# Patient Record
Sex: Male | Born: 1990 | Race: White | Hispanic: No | Marital: Single | Smoking: Current every day smoker
Health system: Southern US, Community
[De-identification: ages and names within clinical notes are randomized; demographics above are authoritative.]

## PROBLEM LIST (undated history)

## (undated) DIAGNOSIS — F329 Major depressive disorder, single episode, unspecified: Secondary | ICD-10-CM

## (undated) DIAGNOSIS — F419 Anxiety disorder, unspecified: Secondary | ICD-10-CM

## (undated) DIAGNOSIS — F319 Bipolar disorder, unspecified: Secondary | ICD-10-CM

## (undated) DIAGNOSIS — F32A Depression, unspecified: Secondary | ICD-10-CM

## (undated) DIAGNOSIS — F909 Attention-deficit hyperactivity disorder, unspecified type: Secondary | ICD-10-CM

---
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
## 2014-10-01 ENCOUNTER — Encounter (HOSPITAL_COMMUNITY): Payer: Self-pay | Admitting: Emergency Medicine

## 2014-10-01 ENCOUNTER — Emergency Department (HOSPITAL_COMMUNITY)
Admission: EM | Admit: 2014-10-01 | Discharge: 2014-10-02 | Disposition: A | Discharge status: Home / Self Care | Payer: Medicaid Other | Attending: Emergency Medicine | Admitting: Emergency Medicine

## 2014-10-01 DIAGNOSIS — Y929 Unspecified place or not applicable: Secondary | ICD-10-CM | POA: Insufficient documentation

## 2014-10-01 DIAGNOSIS — S01112A Laceration without foreign body of left eyelid and periocular area, initial encounter: Secondary | ICD-10-CM | POA: Insufficient documentation

## 2014-10-01 DIAGNOSIS — W540XXA Bitten by dog, initial encounter: Secondary | ICD-10-CM | POA: Insufficient documentation

## 2014-10-01 DIAGNOSIS — S0185XA Open bite of other part of head, initial encounter: Secondary | ICD-10-CM | POA: Insufficient documentation

## 2014-10-01 DIAGNOSIS — Z23 Encounter for immunization: Secondary | ICD-10-CM | POA: Diagnosis not present

## 2014-10-01 DIAGNOSIS — S01552A Open bite of oral cavity, initial encounter: Secondary | ICD-10-CM | POA: Insufficient documentation

## 2014-10-01 DIAGNOSIS — Y939 Activity, unspecified: Secondary | ICD-10-CM | POA: Diagnosis not present

## 2014-10-01 DIAGNOSIS — Z720 Tobacco use: Secondary | ICD-10-CM | POA: Insufficient documentation

## 2014-10-01 DIAGNOSIS — Y999 Unspecified external cause status: Secondary | ICD-10-CM | POA: Insufficient documentation

## 2014-10-01 MED ORDER — TETANUS-DIPHTH-ACELL PERTUSSIS 5-2.5-18.5 LF-MCG/0.5 IM SUSP
0.5000 mL | Freq: Once | INTRAMUSCULAR | Status: AC
  Administered 2014-10-02: 0.5 mL via INTRAMUSCULAR
  Filled 2014-10-01: qty 0.5

## 2014-10-01 MED ORDER — OXYCODONE-ACETAMINOPHEN 5-325 MG PO TABS
1.0000 | ORAL_TABLET | Freq: Once | ORAL | Status: AC
  Administered 2014-10-02: 1 via ORAL
  Filled 2014-10-01: qty 1

## 2014-10-01 MED ORDER — LIDOCAINE-EPINEPHRINE-TETRACAINE (LET) SOLUTION
3.0000 mL | Freq: Once | NASAL | Status: AC
  Administered 2014-10-02: 3 mL via TOPICAL
  Filled 2014-10-01: qty 3

## 2014-10-01 MED ORDER — RABIES IMMUNE GLOBULIN 150 UNIT/ML IM INJ
1500.0000 [IU] | INJECTION | Freq: Once | INTRAMUSCULAR | Status: AC
  Administered 2014-10-02: 1500 [IU] via INTRAMUSCULAR
  Filled 2014-10-01: qty 10

## 2014-10-01 MED ORDER — RABIES VACCINE, PCEC IM SUSR
1.0000 mL | Freq: Once | INTRAMUSCULAR | Status: AC
  Administered 2014-10-02: 1 mL via INTRAMUSCULAR
  Filled 2014-10-01: qty 1
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
## 2014-10-02 MED ORDER — HYDROCODONE-ACETAMINOPHEN 5-325 MG PO TABS: 1.0000 | ORAL_TABLET | Freq: Four times a day (QID) | ORAL | Status: DC | PRN

## 2014-10-02 MED ORDER — AMOXICILLIN-POT CLAVULANATE 875-125 MG PO TABS: 1.0000 | ORAL_TABLET | Freq: Two times a day (BID) | ORAL | Status: DC

## 2014-10-02 NOTE — Discharge Instructions (Signed)
You have been bitten by a dog.  There's a chance of rabies exposure.  Please follow instruction below and follow up with Urgent Care for rabies series.  Take pain medication and antibiotic as prescribed.  Follow up with plastic surgeon in 1 week for further treatment of your facial laceration.  Keep wound cleansed, return if you notice signs of infection.  Animal Bite An animal bite can result in a scratch on the skin, deep open cut, puncture of the skin, crush injury, or tearing away of the skin or a body part. Dogs are responsible for most animal bites. Children are bitten more often than adults. An animal bite can range from very mild to more serious. A small bite from your house pet is no cause for alarm. However, some animal bites can become infected or injure a bone or other tissue. You must seek medical care if:  The skin is broken and bleeding does not slow down or stop after 15 minutes.  The puncture is deep and difficult to clean (such as a cat bite).  Pain, warmth, redness, or pus develops around the wound.  The bite is from a stray animal or rodent. There may be a risk of rabies infection.  The bite is from a snake, raccoon, skunk, fox, coyote, or bat. There may be a risk of rabies infection.  The person bitten has a chronic illness such as diabetes, liver disease, or cancer, or the person takes medicine that lowers the immune system.  There is concern about the location and severity of the bite. It is important to clean and protect an animal bite wound right away to prevent infection. Follow these steps:  Clean the wound with plenty of water and soap.  Apply an antibiotic cream.  Apply gentle pressure over the wound with a clean towel or gauze to slow or stop bleeding.  Elevate the affected area above the heart to help stop any bleeding.  Seek medical care. Getting medical care within 8 hours of the animal bite leads to the best possible outcome. DIAGNOSIS  Your caregiver  will most likely:  Take a detailed history of the animal and the bite injury.  Perform a wound exam.  Take your medical history. Blood tests or X-rays may be performed. Sometimes, infected bite wounds are cultured and sent to a lab to identify the infectious bacteria.  TREATMENT  Medical treatment will depend on the location and type of animal bite as well as the patient's medical history. Treatment may include:  Wound care, such as cleaning and flushing the wound with saline solution, bandaging, and elevating the affected area.  Antibiotics.  Tetanus immunization.  Rabies immunization.  Leaving the wound open to heal. This is often done with animal bites, due to the high risk of infection. However, in certain cases, wound closure with stitches, wound adhesive, skin adhesive strips, or staples may be used. Infected bites that are left untreated may require intravenous (IV) antibiotics and surgical treatment in the hospital. HOME CARE INSTRUCTIONS  Follow your caregiver's instructions for wound care.  Take all medicines as directed.  If your caregiver prescribes antibiotics, take them as directed. Finish them even if you start to feel better.  Follow up with your caregiver for further exams or immunizations as directed. You may need a tetanus shot if:  You cannot remember when you had your last tetanus shot.  You have never had a tetanus shot.  The injury broke your skin. If you get a  tetanus shot, your arm may swell, get red, and feel warm to the touch. This is common and not a problem. If you need a tetanus shot and you choose not to have one, there is a rare chance of getting tetanus. Sickness from tetanus can be serious. SEEK MEDICAL CARE IF:  You notice warmth, redness, soreness, swelling, pus discharge, or a bad smell coming from the wound.  You have a red line on the skin coming from the wound.  You have a fever, chills, or a general ill feeling.  You have nausea  or vomiting.  You have continued or worsening pain.  You have trouble moving the injured part.  You have other questions or concerns. MAKE SURE YOU:  Understand these instructions.  Will watch your condition.  Will get help right away if you are not doing well or get worse. Document Released: 03/14/2011 Document Revised: 09/18/2011 Document Reviewed: 03/14/2011 [facility] Patient Information 2015 [place], [place]. This information is not intended to replace advice given to you by your health care provider. Make sure you discuss any questions you have with your health care provider.       [facility]     [address]                                             [place], [place] [address]              [phone]                                  INSTRUCTIONS FOR THE PATIENT  Patient's Name: Ricardo Gay                     Original Order Date:  10/02/2014  Medical Record Number: [id number]  ED Physician: . Primary Diagnosis: Rabies Exposure       PCP: No primary care provider on file. Marland Kitchen RABIES VACCINE:  Patient Phone Number: (home) [phone] (home)    (cell)  No relevant phone numbers on file.    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 Species of Animal: dogs (1) IMMUNOGLOBULIN INJECTION GIVEN IN THE ER?: yes   You have been seen in the Emergency Department for a possible rabies exposure.  You must return for the additional vaccine doses to the Urgent Care Center ([address]) next to [facility].  If needed, your immunoglobulin follow-up injections, need to be scheduled for the dates below. If your first visit should fall on a weekend day, please come anytime between the hours of 9am-7pm.  DAY 0:  Date 3/25     To: ER  DAY 3:  Date 3/28     To:  Urgent Care  DAY 7:  Date 4/1     To:  Urgent Care  DAY 14:  Date 4/8   To:  Urgent Care   The Urgent Care Center is open from 8am-8pm Monday thru Friday and 9am-7pm on Saturdays and  Sundays.  There will be a minimal fee for the injection that will be billed to your insurance company along with the charge for the vaccine.  Date: 10/02/2014  Patient Signature: _________________________________________________  [facility] Copy   Patient Copy      Pharmacy Copy

## 2015-02-06 ENCOUNTER — Emergency Department (HOSPITAL_COMMUNITY)
Admission: EM | Admit: 2015-02-06 | Discharge: 2015-02-06 | Disposition: A | Discharge status: Home / Self Care | Payer: Medicaid Other | Attending: Emergency Medicine | Admitting: Emergency Medicine

## 2015-02-06 ENCOUNTER — Emergency Department (HOSPITAL_COMMUNITY): Payer: Medicaid Other

## 2015-02-06 ENCOUNTER — Encounter (HOSPITAL_COMMUNITY): Payer: Self-pay | Admitting: Emergency Medicine

## 2015-02-06 DIAGNOSIS — S61215A Laceration without foreign body of left ring finger without damage to nail, initial encounter: Secondary | ICD-10-CM | POA: Diagnosis not present

## 2015-02-06 DIAGNOSIS — Y998 Other external cause status: Secondary | ICD-10-CM | POA: Insufficient documentation

## 2015-02-06 DIAGNOSIS — S70312A Abrasion, left thigh, initial encounter: Secondary | ICD-10-CM | POA: Diagnosis not present

## 2015-02-06 DIAGNOSIS — Z720 Tobacco use: Secondary | ICD-10-CM | POA: Diagnosis not present

## 2015-02-06 DIAGNOSIS — S70311A Abrasion, right thigh, initial encounter: Secondary | ICD-10-CM | POA: Insufficient documentation

## 2015-02-06 DIAGNOSIS — Y9389 Activity, other specified: Secondary | ICD-10-CM | POA: Diagnosis not present

## 2015-02-06 DIAGNOSIS — S40212A Abrasion of left shoulder, initial encounter: Secondary | ICD-10-CM | POA: Diagnosis not present

## 2015-02-06 DIAGNOSIS — S42032A Displaced fracture of lateral end of left clavicle, initial encounter for closed fracture: Secondary | ICD-10-CM | POA: Insufficient documentation

## 2015-02-06 DIAGNOSIS — Y92410 Unspecified street and highway as the place of occurrence of the external cause: Secondary | ICD-10-CM | POA: Diagnosis not present

## 2015-02-06 DIAGNOSIS — S4992XA Unspecified injury of left shoulder and upper arm, initial encounter: Secondary | ICD-10-CM | POA: Diagnosis present

## 2015-02-06 DIAGNOSIS — S42002A Fracture of unspecified part of left clavicle, initial encounter for closed fracture: Secondary | ICD-10-CM

## 2015-02-06 MED ORDER — FENTANYL CITRATE (PF) 100 MCG/2ML IJ SOLN: 100.0000 ug | Freq: Once | INTRAMUSCULAR | Status: DC

## 2015-02-06 MED ORDER — OXYCODONE-ACETAMINOPHEN 5-325 MG PO TABS: 1.0000 | ORAL_TABLET | ORAL | Status: DC | PRN

## 2015-02-06 MED ORDER — LIDOCAINE HCL (PF) 2 % IJ SOLN
10.0000 mL | Freq: Once | INTRAMUSCULAR | Status: AC
  Administered 2015-02-06: 10 mL
  Filled 2015-02-06: qty 10

## 2015-02-06 MED ORDER — HYDROMORPHONE HCL 1 MG/ML IJ SOLN
1.0000 mg | Freq: Once | INTRAMUSCULAR | Status: AC
  Administered 2015-02-06: 1 mg via INTRAVENOUS
  Filled 2015-02-06: qty 1

## 2015-02-06 MED ORDER — FENTANYL CITRATE (PF) 100 MCG/2ML IJ SOLN
100.0000 ug | Freq: Once | INTRAMUSCULAR | Status: AC
  Administered 2015-02-06: 100 ug via INTRAVENOUS
  Filled 2015-02-06 (×2): qty 2
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
## 2015-02-06 NOTE — ED Notes (Signed)
Pt able to ambulate independently 

## 2015-02-06 NOTE — Progress Notes (Signed)
Orthopedic Tech Progress Note Patient Details:  Ricardo Gay 1991/01/21 [id number]  Ortho Devices Type of Ortho Device: Finger splint Ortho Device/Splint Interventions: Application   Cammer, Mickie Bail 02/06/2015, 4:18 PM

## 2015-04-16 ENCOUNTER — Encounter (HOSPITAL_COMMUNITY): Payer: Self-pay | Admitting: Emergency Medicine

## 2015-04-16 ENCOUNTER — Emergency Department (HOSPITAL_COMMUNITY)
Admission: EM | Admit: 2015-04-16 | Discharge: 2015-04-17 | Disposition: A | Discharge status: Home / Self Care | Payer: Medicaid Other | Attending: Emergency Medicine | Admitting: Emergency Medicine

## 2015-04-16 DIAGNOSIS — F1210 Cannabis abuse, uncomplicated: Secondary | ICD-10-CM | POA: Insufficient documentation

## 2015-04-16 DIAGNOSIS — F4325 Adjustment disorder with mixed disturbance of emotions and conduct: Secondary | ICD-10-CM | POA: Diagnosis not present

## 2015-04-16 DIAGNOSIS — F4329 Adjustment disorder with other symptoms: Secondary | ICD-10-CM

## 2015-04-16 DIAGNOSIS — R45851 Suicidal ideations: Secondary | ICD-10-CM

## 2015-04-16 DIAGNOSIS — F911 Conduct disorder, childhood-onset type: Secondary | ICD-10-CM | POA: Diagnosis not present

## 2015-04-16 DIAGNOSIS — R4689 Other symptoms and signs involving appearance and behavior: Secondary | ICD-10-CM

## 2015-04-16 DIAGNOSIS — Z720 Tobacco use: Secondary | ICD-10-CM | POA: Diagnosis not present

## 2015-04-16 DIAGNOSIS — Z046 Encounter for general psychiatric examination, requested by authority: Secondary | ICD-10-CM | POA: Diagnosis present

## 2015-04-16 LAB — CBC
HCT: 54.3 % — ABNORMAL HIGH (ref 39.0–52.0)
Hemoglobin: 19.6 g/dL — ABNORMAL HIGH (ref 13.0–17.0)
MCH: 33.7 pg (ref 26.0–34.0)
MCHC: 36.1 g/dL — ABNORMAL HIGH (ref 30.0–36.0)
MCV: 93.3 fL (ref 78.0–100.0)
Platelets: 215 10*3/uL (ref 150–400)
RBC: 5.82 MIL/uL — ABNORMAL HIGH (ref 4.22–5.81)
RDW: 11.9 % (ref 11.5–15.5)
WBC: 8.9 10*3/uL (ref 4.0–10.5)

## 2015-04-16 LAB — COMPREHENSIVE METABOLIC PANEL
ALK PHOS: 94 U/L (ref 38–126)
ALT: 29 U/L (ref 17–63)
ANION GAP: 7 (ref 5–15)
AST: 28 U/L (ref 15–41)
Albumin: 4.5 g/dL (ref 3.5–5.0)
BUN: 12 mg/dL (ref 6–20)
CALCIUM: 9.6 mg/dL (ref 8.9–10.3)
CO2: 27 mmol/L (ref 22–32)
CREATININE: 0.72 mg/dL (ref 0.61–1.24)
Chloride: 105 mmol/L (ref 101–111)
Glucose, Bld: 113 mg/dL — ABNORMAL HIGH (ref 65–99)
Potassium: 4.5 mmol/L (ref 3.5–5.1)
SODIUM: 139 mmol/L (ref 135–145)
TOTAL PROTEIN: 7.9 g/dL (ref 6.5–8.1)
Total Bilirubin: 0.8 mg/dL (ref 0.3–1.2)

## 2015-04-16 LAB — ACETAMINOPHEN LEVEL: Acetaminophen (Tylenol), Serum: <10 ug/mL — ABNORMAL LOW (ref 10–30)

## 2015-04-16 LAB — ETHANOL

## 2015-04-16 LAB — SALICYLATE LEVEL: Salicylate Lvl: <4 mg/dL (ref 2.8–30.0)

## 2015-04-16 MED ORDER — ALUM & MAG HYDROXIDE-SIMETH 200-200-20 MG/5ML PO SUSP: 30.0000 mL | ORAL | Status: DC | PRN

## 2015-04-16 MED ORDER — NICOTINE 21 MG/24HR TD PT24
21.0000 mg | MEDICATED_PATCH | Freq: Every day | TRANSDERMAL | Status: DC
  Administered 2015-04-17: 21 mg via TRANSDERMAL
  Filled 2015-04-16: qty 1

## 2015-04-16 MED ORDER — ZOLPIDEM TARTRATE 5 MG PO TABS: 5.0000 mg | ORAL_TABLET | Freq: Every evening | ORAL | Status: DC | PRN

## 2015-04-16 MED ORDER — ACETAMINOPHEN 325 MG PO TABS: 650.0000 mg | ORAL_TABLET | ORAL | Status: DC | PRN

## 2015-04-16 MED ORDER — IBUPROFEN 200 MG PO TABS: 600.0000 mg | ORAL_TABLET | Freq: Three times a day (TID) | ORAL | Status: DC | PRN

## 2015-04-16 MED ORDER — LORAZEPAM 1 MG PO TABS: 1.0000 mg | ORAL_TABLET | Freq: Three times a day (TID) | ORAL | Status: DC | PRN

## 2015-04-16 MED ORDER — ONDANSETRON HCL 4 MG PO TABS: 4.0000 mg | ORAL_TABLET | Freq: Three times a day (TID) | ORAL | Status: DC | PRN

## 2015-04-16 NOTE — ED Notes (Signed)
Pt. Noted sleeping in room. No complaints or concerns voiced. No distress or abnormal behavior noted. Will continue to monitor with security cameras. Q 15 minute rounds continue. 

## 2015-04-16 NOTE — ED Notes (Signed)
Pt. To [facility] from ED ambulatory without difficulty, to room 36 . Report from [facility]. Pt. Is alert and oriented, warm and dry in no distress. Pt. Denies SI, HI, and AVH. Pt. Calm and cooperative. Pt. Made aware of security cameras and Q15 minute rounds. Pt. Encouraged to let Nursing staff know of any concerns or needs.
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
## 2015-04-16 NOTE — [facility] Counselor (Signed)
Disposition: Per Hulan Fess, NP, Pt to be re-evaluated by psychiatry in the morning in order to uphold or rescind IVC.
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
## 2015-04-16 NOTE — ED Notes (Signed)
Bed: [facility]4 Expected date:  Expected time:  Means of arrival:  Comments: IVC 
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
## 2015-04-16 NOTE — ED Notes (Signed)
Pt. Noted in room. No complaints or concerns voiced. No distress or abnormal behavior noted. Will continue to monitor with security cameras. Q 15 minute rounds continue. 

## 2015-04-16 NOTE — ED Notes (Signed)
Pt denies SI/HI. Pt calm and cooperative in triage.

## 2015-04-17 DIAGNOSIS — F4329 Adjustment disorder with other symptoms: Secondary | ICD-10-CM | POA: Diagnosis present

## 2015-04-17 LAB — RAPID URINE DRUG SCREEN, HOSP PERFORMED
AMPHETAMINES: NOT DETECTED
BARBITURATES: NOT DETECTED
Benzodiazepines: NOT DETECTED
Cocaine: NOT DETECTED
OPIATES: NOT DETECTED
TETRAHYDROCANNABINOL: POSITIVE — AB

## 2015-04-17 NOTE — ED Notes (Signed)
Pt. Noted sleeping in room. No complaints or concerns voiced. No distress or abnormal behavior noted. Will continue to monitor with security cameras. Q 15 minute rounds continue. 
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
## 2015-04-17 NOTE — [facility] Suicide Risk Assessment (Signed)
Suicide Risk Assessment  Discharge Assessment   [facility] Discharge Suicide Risk Assessment   Demographic Factors:  Male, Adolescent or young adult and Caucasian  Total Time spent with patient: 45 minutes  Musculoskeletal: Strength & Muscle Tone: within normal limits Gait & Station: normal Patient leans: N/A  Psychiatric Specialty Exam: Review of Systems  Constitutional: Negative.   HENT: Negative.   Eyes: Negative.   Respiratory: Negative.   Cardiovascular: Negative.   Gastrointestinal: Negative.   Genitourinary: Negative.   Musculoskeletal: Negative.   Skin: Negative.   Neurological: Negative.   Endo/Heme/Allergies: Negative.   Psychiatric/Behavioral: Positive for substance abuse.    Blood pressure 113/66, pulse 59, temperature 98.4 F (36.9 C), temperature source Oral, resp. rate 16, SpO2 98 %.There is no weight on file to calculate BMI.  General Appearance: Casual  Eye Contact::  Good  Speech:  Normal Rate  Volume:  Normal  Mood:  Euthymic  Affect:  Congruent  Thought Process:  Coherent  Orientation:  Full (Time, Place, and Person)  Thought Content:  WDL  Suicidal Thoughts:  No  Homicidal Thoughts:  No  Memory:  Immediate;   Good Recent;   Good Remote;   Good  Judgement:  Fair  Insight:  Good  Psychomotor Activity:  Normal  Concentration:  Good  Recall:  Good  Fund of Knowledge:Good  Language: Good  Akathisia:  No  Handed:  Right  AIMS (if indicated):     Assets:  Housing Intimacy Leisure Time Physical Health Resilience Social Support  ADL's:  Intact  Cognition: WNL  Sleep:         Has this patient used any form of tobacco in the last 30 days? (Cigarettes, Smokeless Tobacco, Cigars, and/or Pipes) No  Mental Status Per Nursing Assessment::   On Admission:   Altercation with his grandmother  Current Mental Status by Physician: NA  Loss Factors: NA  Historical Factors: NA  Risk Reduction Factors:   Sense of responsibility to family, Living  with another person, especially a relative and Positive social support  Continued Clinical Symptoms:  None  Cognitive Features That Contribute To Risk:  None    Suicide Risk:  Minimal: No identifiable suicidal ideation.  Patients presenting with no risk factors but with morbid ruminations; may be classified as minimal risk based on the severity of the depressive symptoms  Principal Problem: Adjustment disorder with disturbance of emotion Discharge Diagnoses:  Patient Active Problem List   Diagnosis Date Noted  . Adjustment disorder with disturbance of emotion [F43.29] 04/17/2015    Priority: High      Plan Of Care/Follow-up recommendations:  Activity:  as tolerated Diet:  heart healthy diet  Is patient on multiple antipsychotic therapies at discharge:  No   Has Patient had three or more failed trials of antipsychotic monotherapy by history:  No  Recommended Plan for Multiple Antipsychotic Therapies: NA    LORD, JAMISON, PMH-NP 04/17/2015, 2:10 PM

## 2015-04-17 NOTE — ED Notes (Signed)
Dr Shela Commons and Catha Nottingham np into see

## 2015-04-17 NOTE — ED Notes (Signed)
IVC has been rescinded pt to be dc'd home

## 2015-04-17 NOTE — ED Notes (Signed)
Patient resting in bed watching TV

## 2015-04-17 NOTE — ED Notes (Addendum)
Written dc instructions reviewed w/ pt.  Pt encouraged to return/seek treatment for any suicidal/homicidal thoughts or urges, pt denies si/hi/avh on dc.  Pt also encouraged to keep his apt with the psychiatrist this month.  Pt verbalized understanding.  Pt ambulatory w/o difficulty to dc window, belongings returned after leaving the area.  Pt instructed to remove the nicotine patch prior to smoking.

## 2015-04-17 NOTE — ED Notes (Signed)
Pt. Noted in room. No complaints or concerns voiced. No distress or abnormal behavior noted. Will continue to monitor with security cameras. Q 15 minute rounds continue. 

## 2015-04-17 NOTE — ED Notes (Signed)
Up to the desk on the phone 

## 2015-04-17 NOTE — Progress Notes (Signed)
1:58pm. Dr. Shela Commons rescinds pt's IVC. CSW faxed to magistrate and filed paperwork.  [facility] Clinical Social Worker [facility] phone: [phone]

## 2015-04-17 NOTE — ED Notes (Signed)
Up to the bathroom 

## 2015-09-23 ENCOUNTER — Inpatient Hospital Stay (HOSPITAL_COMMUNITY)
Admission: AD | Admit: 2015-09-23 | Discharge: 2015-09-27 | DRG: 897 | Disposition: A | Discharge status: Home / Self Care | Payer: Medicaid Other | Source: Intra-hospital | Attending: Psychiatry | Admitting: Psychiatry

## 2015-09-23 ENCOUNTER — Emergency Department (HOSPITAL_COMMUNITY)
Admission: EM | Admit: 2015-09-23 | Discharge: 2015-09-23 | Disposition: A | Discharge status: Other Acute Inpatient Hospital | Payer: Medicaid Other | Attending: Emergency Medicine | Admitting: Emergency Medicine

## 2015-09-23 ENCOUNTER — Encounter (HOSPITAL_COMMUNITY): Payer: Self-pay | Admitting: Emergency Medicine

## 2015-09-23 ENCOUNTER — Encounter (HOSPITAL_COMMUNITY): Payer: Self-pay | Admitting: *Deleted

## 2015-09-23 DIAGNOSIS — F1514 Other stimulant abuse with stimulant-induced mood disorder: Principal | ICD-10-CM | POA: Diagnosis present

## 2015-09-23 DIAGNOSIS — F1510 Other stimulant abuse, uncomplicated: Secondary | ICD-10-CM | POA: Insufficient documentation

## 2015-09-23 DIAGNOSIS — F4320 Adjustment disorder, unspecified: Secondary | ICD-10-CM | POA: Diagnosis present

## 2015-09-23 DIAGNOSIS — F17210 Nicotine dependence, cigarettes, uncomplicated: Secondary | ICD-10-CM | POA: Diagnosis present

## 2015-09-23 DIAGNOSIS — F1910 Other psychoactive substance abuse, uncomplicated: Secondary | ICD-10-CM | POA: Diagnosis present

## 2015-09-23 DIAGNOSIS — F909 Attention-deficit hyperactivity disorder, unspecified type: Secondary | ICD-10-CM | POA: Diagnosis present

## 2015-09-23 DIAGNOSIS — F12188 Cannabis abuse with other cannabis-induced disorder: Secondary | ICD-10-CM | POA: Diagnosis present

## 2015-09-23 DIAGNOSIS — F1994 Other psychoactive substance use, unspecified with psychoactive substance-induced mood disorder: Secondary | ICD-10-CM | POA: Diagnosis present

## 2015-09-23 DIAGNOSIS — F1210 Cannabis abuse, uncomplicated: Secondary | ICD-10-CM | POA: Insufficient documentation

## 2015-09-23 DIAGNOSIS — G4700 Insomnia, unspecified: Secondary | ICD-10-CM | POA: Diagnosis present

## 2015-09-23 DIAGNOSIS — Z818 Family history of other mental and behavioral disorders: Secondary | ICD-10-CM | POA: Diagnosis not present

## 2015-09-23 DIAGNOSIS — F319 Bipolar disorder, unspecified: Secondary | ICD-10-CM | POA: Diagnosis present

## 2015-09-23 DIAGNOSIS — Z79899 Other long term (current) drug therapy: Secondary | ICD-10-CM | POA: Insufficient documentation

## 2015-09-23 DIAGNOSIS — F419 Anxiety disorder, unspecified: Secondary | ICD-10-CM | POA: Diagnosis present

## 2015-09-23 DIAGNOSIS — F17200 Nicotine dependence, unspecified, uncomplicated: Secondary | ICD-10-CM | POA: Insufficient documentation

## 2015-09-23 DIAGNOSIS — F0630 Mood disorder due to known physiological condition, unspecified: Secondary | ICD-10-CM | POA: Diagnosis not present

## 2015-09-23 DIAGNOSIS — R45851 Suicidal ideations: Secondary | ICD-10-CM | POA: Insufficient documentation

## 2015-09-23 HISTORY — DX: Depression, unspecified: F32.A

## 2015-09-23 HISTORY — DX: Anxiety disorder, unspecified: F41.9

## 2015-09-23 HISTORY — DX: Attention-deficit hyperactivity disorder, unspecified type: F90.9

## 2015-09-23 HISTORY — DX: Major depressive disorder, single episode, unspecified: F32.9

## 2015-09-23 HISTORY — DX: Bipolar disorder, unspecified: F31.9

## 2015-09-23 LAB — COMPREHENSIVE METABOLIC PANEL
ALBUMIN: 4.8 g/dL (ref 3.5–5.0)
ALT: 20 U/L (ref 17–63)
ANION GAP: 11 (ref 5–15)
AST: 26 U/L (ref 15–41)
Alkaline Phosphatase: 69 U/L (ref 38–126)
BILIRUBIN TOTAL: 1.1 mg/dL (ref 0.3–1.2)
BUN: 14 mg/dL (ref 6–20)
CHLORIDE: 103 mmol/L (ref 101–111)
CO2: 26 mmol/L (ref 22–32)
Calcium: 9.6 mg/dL (ref 8.9–10.3)
Creatinine, Ser: 0.78 mg/dL (ref 0.61–1.24)
GFR calc Af Amer: >60 mL/min (ref >60)
GFR calc non Af Amer: >60 mL/min (ref >60)
GLUCOSE: 95 mg/dL (ref 65–99)
POTASSIUM: 3.7 mmol/L (ref 3.5–5.1)
SODIUM: 140 mmol/L (ref 135–145)
TOTAL PROTEIN: 7.7 g/dL (ref 6.5–8.1)

## 2015-09-23 LAB — RAPID URINE DRUG SCREEN, HOSP PERFORMED
Amphetamines: POSITIVE — AB
BARBITURATES: NOT DETECTED
BENZODIAZEPINES: NOT DETECTED
Cocaine: NOT DETECTED
Opiates: NOT DETECTED
Tetrahydrocannabinol: POSITIVE — AB

## 2015-09-23 LAB — ACETAMINOPHEN LEVEL

## 2015-09-23 LAB — CBC WITH DIFFERENTIAL/PLATELET
BASOS PCT: 0 %
Basophils Absolute: 0 10*3/uL (ref 0.0–0.1)
Eosinophils Absolute: 0.2 10*3/uL (ref 0.0–0.7)
Eosinophils Relative: 3 %
HEMATOCRIT: 45 % (ref 39.0–52.0)
Hemoglobin: 16.2 g/dL (ref 13.0–17.0)
LYMPHS PCT: 21 %
Lymphs Abs: 1.7 10*3/uL (ref 0.7–4.0)
MCH: 31.3 pg (ref 26.0–34.0)
MCHC: 36 g/dL (ref 30.0–36.0)
MCV: 86.9 fL (ref 78.0–100.0)
MONO ABS: 0.9 10*3/uL (ref 0.1–1.0)
MONOS PCT: 10 %
NEUTROS ABS: 5.6 10*3/uL (ref 1.7–7.7)
NEUTROS PCT: 66 %
PLATELETS: 190 10*3/uL (ref 150–400)
RBC: 5.18 MIL/uL (ref 4.22–5.81)
RDW: 11.9 % (ref 11.5–15.5)
WBC: 8.4 10*3/uL (ref 4.0–10.5)

## 2015-09-23 LAB — SALICYLATE LEVEL

## 2015-09-23 LAB — ETHANOL: Alcohol, Ethyl (B): <5 mg/dL (ref <5)

## 2015-09-23 LAB — VALPROIC ACID LEVEL: Valproic Acid Lvl: <10 ug/mL — ABNORMAL LOW (ref 50.0–100.0)

## 2015-09-23 MED ORDER — ONDANSETRON HCL 4 MG PO TABS: 4.0000 mg | ORAL_TABLET | Freq: Three times a day (TID) | ORAL | Status: DC | PRN

## 2015-09-23 MED ORDER — ALUM & MAG HYDROXIDE-SIMETH 200-200-20 MG/5ML PO SUSP: 30.0000 mL | ORAL | Status: DC | PRN

## 2015-09-23 MED ORDER — DIVALPROEX SODIUM ER 500 MG PO TB24: 500.0000 mg | ORAL_TABLET | Freq: Every day | ORAL | Status: DC

## 2015-09-23 MED ORDER — ACETAMINOPHEN 325 MG PO TABS: 650.0000 mg | ORAL_TABLET | ORAL | Status: DC | PRN

## 2015-09-23 MED ORDER — MAGNESIUM HYDROXIDE 400 MG/5ML PO SUSP: 30.0000 mL | Freq: Every day | ORAL | Status: DC | PRN

## 2015-09-23 MED ORDER — LORAZEPAM 1 MG PO TABS: 1.0000 mg | ORAL_TABLET | Freq: Three times a day (TID) | ORAL | Status: DC | PRN

## 2015-09-23 MED ORDER — ACETAMINOPHEN 325 MG PO TABS: 650.0000 mg | ORAL_TABLET | Freq: Four times a day (QID) | ORAL | Status: DC | PRN

## 2015-09-23 MED ORDER — LORAZEPAM 1 MG PO TABS
1.0000 mg | ORAL_TABLET | Freq: Three times a day (TID) | ORAL | Status: DC | PRN
  Administered 2015-09-24 – 2015-09-26 (×3): 1 mg via ORAL
  Filled 2015-09-23 (×2): qty 1
  Filled 2015-09-23: qty 2

## 2015-09-23 MED ORDER — IBUPROFEN 600 MG PO TABS: 600.0000 mg | ORAL_TABLET | Freq: Three times a day (TID) | ORAL | Status: DC | PRN

## 2015-09-23 MED ORDER — NICOTINE 21 MG/24HR TD PT24
21.0000 mg | MEDICATED_PATCH | Freq: Every day | TRANSDERMAL | Status: DC
  Administered 2015-09-23 – 2015-09-26 (×3): 21 mg via TRANSDERMAL
  Filled 2015-09-23 (×9): qty 1

## 2015-09-23 MED ORDER — DIVALPROEX SODIUM ER 500 MG PO TB24
500.0000 mg | ORAL_TABLET | Freq: Every day | ORAL | Status: DC
  Administered 2015-09-24 – 2015-09-26 (×3): 500 mg via ORAL
  Filled 2015-09-23 (×7): qty 1

## 2015-09-23 MED ORDER — IBUPROFEN 200 MG PO TABS: 600.0000 mg | ORAL_TABLET | Freq: Three times a day (TID) | ORAL | Status: DC | PRN
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
## 2015-09-23 NOTE — ED Notes (Signed)
Meal provided to patient.  He is unwilling to have a conversation with this Clinical research associatewriter.

## 2015-09-23 NOTE — Tx Team (Signed)
Initial Interdisciplinary Treatment Plan   PATIENT STRESSORS: Financial difficulties Marital or family conflict Medication change or noncompliance Substance abuse   PATIENT STRENGTHS: Capable of independent living Wellsite geologistCommunication skills General fund of knowledge Motivation for treatment/growth Physical Health Supportive family/friends   PROBLEM LIST: Problem List/Patient Goals Date to be addressed Date deferred Reason deferred Estimated date of resolution  "suicidal thoughts" 10/03/2015   D/c  "substance abuse" 09/23/2015   D/c  "anxiety" 09/23/2015   D/c  "depression" 09/23/2015   D/c                                 DISCHARGE CRITERIA:  Ability to meet basic life and health needs Adequate post-discharge living arrangements Improved stabilization in mood, thinking, and/or behavior Medical problems require only outpatient monitoring Motivation to continue treatment in a less acute level of care Need for constant or close observation no longer present Reduction of life-threatening or endangering symptoms to within safe limits Safe-care adequate arrangements made Verbal commitment to aftercare and medication compliance Withdrawal symptoms are absent or subacute and managed without 24-hour nursing intervention  PRELIMINARY DISCHARGE PLAN: Attend aftercare/continuing care group Attend PHP/IOP Attend 12-step recovery group Outpatient therapy Participate in family therapy Return to previous living arrangement  PATIENT/FAMIILY INVOLVEMENT: This treatment plan has been presented to and reviewed with the patient, Sandi CarneCody A Hegel.  The patient and family have been given the opportunity to ask questions and make suggestions.  Earline MayotteKnight, Eldean Nanna Shephard 09/23/2015, 7:15 PM
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
## 2015-09-23 NOTE — Plan of Care (Signed)
Problem: Ineffective individual coping Goal: STG: Patient will remain free from self harm Outcome: Progressing Pt safe on the unit at this time     
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
## 2015-09-23 NOTE — Progress Notes (Signed)
D: Pt denies SI/HI/AVH. Pt has no insight. Pt stated his SSI stopped. Pt plans to D/C and get a place with his fiance.   A: Pt was offered support and encouragement. Pt was given scheduled medications. Pt was encourage to attend groups. Q 15 minute checks were done for safety.   R:Pt attends groups and interacts well with peers and staff. Pt is taking medication .Pt receptive to treatment and safety maintained on unit.

## 2015-09-24 ENCOUNTER — Encounter (HOSPITAL_COMMUNITY): Payer: Self-pay | Admitting: Psychiatry

## 2015-09-24 DIAGNOSIS — F1994 Other psychoactive substance use, unspecified with psychoactive substance-induced mood disorder: Secondary | ICD-10-CM | POA: Diagnosis present

## 2015-09-24 DIAGNOSIS — F0630 Mood disorder due to known physiological condition, unspecified: Secondary | ICD-10-CM

## 2015-09-24 DIAGNOSIS — F1910 Other psychoactive substance abuse, uncomplicated: Secondary | ICD-10-CM

## 2015-09-24 NOTE — Progress Notes (Signed)
D.  Pt pleasant on approach, denies complaints at this time.  Positive for evening AA group, interacting appropriately if somewhat loudly with peers on unit.  Denies SI/HI/hallucinations at this time.  A.  Support and encouragement offered, medication given as ordered  R.  Pt remains safe on the unit, will continue to monitor.

## 2015-09-24 NOTE — [facility] Group Notes (Signed)
[facility] LCSW Group Therapy  09/24/2015 1:23 PM  Type of Therapy:  Group Therapy  Participation Level:  None  Participation Quality:  Inattentive  Affect:  Irritable  Cognitive:  Lacking  Insight:  None  Engagement in Therapy:  None  Modes of Intervention:  Confrontation, Discussion, Education, Exploration, Problem-solving, Rapport Building, Socialization and Support  Summary of Progress/Problems: Feelings around Relapse. Group members discussed the meaning of relapse and shared personal stories of relapse, how it affected them and others, and how they perceived themselves during this time. Group members were encouraged to identify triggers, warning signs and coping skills used when facing the possibility of relapse. Social supports were discussed and explored in detail. Post Acute Withdrawal Syndrome (handout provided) was introduced and examined. Pt's were encouraged to ask questions, talk about key points associated with PAWS, and process this information in terms of relapse prevention. Selena BattenCody was inattentive and disengaged during group. He colored pictures throughout and did not contribute to group discussion, showing resistance to active participation. At this time, Selena BattenCody does not show progress in the group setting with limited insight.   Smart, Keagon Glascoe LCSW 09/24/2015, 1:23 PM

## 2015-09-24 NOTE — Progress Notes (Signed)
Patient attended AA group meeting tonight.  

## 2015-09-24 NOTE — Tx Team (Signed)
Interdisciplinary Treatment Plan Update (Adult)  Date:  09/24/2015  Time Reviewed:  8:30 AM   Progress in Treatment: Attending groups: No. New to unit. Continuing to assess.  Participating in groups:  No. Taking medication as prescribed:  Yes. Tolerating medication:  Yes. Family/Significant othe contact made:  SPE required for this pt. Pt gave permission to contact his grandmother for SPE and collateral information. Pt reports that she lied on IVC paperwork and "none of that is true." CSW assessing.  Patient understands diagnosis:  Yes. and As evidenced by:  seeking treatment for Discussing patient identified problems/goals with staff:  Yes. Medical problems stabilized or resolved:  Yes. Denies suicidal/homicidal ideation: Yes. Issues/concerns per patient self-inventory:  Other:  Discharge Plan or Barriers: CSW assessing for appropriate referrals.   Reason for Continuation of Hospitalization: Anxiety Medication stabilization  Comments:  Ricardo Gay is an 25 y.o. male. Patient brought to [facility] by [employer] with IVC papers. The IVC papers were taken out by his grandmother [name]) [phone]. IVC reads: "Respondent diagnosed ADHD, ODD, and Bipolar. He is prescribed Depakote but doesn't take it. He was receiving SSI for his diagnosis but the check stopped recently. He is depressed and having a hard time. He is abusing Crystal Meth, Molly, and Huffing. He drinks alcohol daily until he passes out. This morning at 4:30 am, he put a knife to his throat and threatened to kill himself. He has been fighting his girlfriend for weeks and she has been threatening to leave him. Over the past 24 hrs he would not let her leave their room. They are both physically aggressive with each other. He is a danger to self and others." Patient receives outpatient services at [facility]. Patient sts that he is medication complaint. Patient reports social alcohol use and daily THC use. UDS + for amphetamines and  THC. Diagnosis: Bipolar I Disorder, ADHD, Substance Use  Estimated length of stay:  3-5 days   New goal(s): to develop effective aftercare plan.   Additional Comments:  Patient and CSW reviewed pt's identified goals and treatment plan. Patient verbalized understanding and agreed to treatment plan. CSW reviewed [facility] "Discharge Process and Patient Involvement" Form. Pt verbalized understanding of information provided and signed form.    Review of initial/current patient goals per problem list:  1. Goal(s): Patient will participate in aftercare plan  Met: No.   Target date: at discharge  As evidenced by: Patient will participate within aftercare plan AEB aftercare provider and housing plan at discharge being identified.  3/17: CSW assessing for appropriate referrals. [facility] for outpatient services currently.   2. Goal (s): Patient will exhibit decreased depressive symptoms and suicidal ideations.  Met: No.    Target date: at discharge  As evidenced by: Patient will utilize self rating of depression at 3 or below and demonstrate decreased signs of depression or be deemed stable for discharge by MD.  3/17: Pt rates depression as high. Denies SI/HI/AVH.   3. Goal(s): Patient will demonstrate decreased signs of withdrawal due to substance abuse  Met:No.   Target date:at discharge   As evidenced by: Patient will produce a CIWA/COWS score of 0, have stable vitals signs, and no symptoms of withdrawal.  3/17: Pt reports mild withdrawals with COWS of 1 and low sitting BP. All other vitals are stable.   Attendees: Patient:   09/24/2015 8:30 AM   Family:   09/24/2015 8:30 AM   Physician:  Dr. Carlton Adam, MD 09/24/2015 8:30 AM   Nursing:  Larina Bras RN 09/24/2015 8:30 AM   Clinical Social Worker: Maxie Better, LCSW 09/24/2015 8:30 AM   Clinical Social Worker: Erasmo Downer Drinkard LCSW; Peri Maris LCSWA 09/24/2015 8:30 AM   Other:  Gerline Legacy Nurse Case Manager 09/24/2015 8:30  AM   Other:  Agustina Caroli, NP 09/24/2015 8:30 AM   Other:   09/24/2015 8:30 AM   Other:  09/24/2015 8:30 AM   Other:  09/24/2015 8:30 AM   Other:  09/24/2015 8:30 AM    09/24/2015 8:30 AM    09/24/2015 8:30 AM    09/24/2015 8:30 AM    09/24/2015 8:30 AM    Scribe for Treatment Team:   Maxie Better, LCSW 09/24/2015 8:30 AM

## 2015-09-24 NOTE — [facility] Group Notes (Signed)
[facility]   09/24/2015 11:34 AM  Participation Quality:  Invited. DID NOT ATTEND. Pt chose to remain in bed to rest.   Smart, Marcel Sorter LCSW

## 2015-09-24 NOTE — BHH Counselor (Signed)
Adult Comprehensive Assessment  Patient ID: Ricardo Gay, male   DOB: March 11, 1991, 25 y.o.   MRN: [id number]  Information Source: Information source: Patient  Current Stressors:  Family Relationships: strained with grandmother; close to fiance. "okay with mother." Housing / Lack of housing: lives in full house--grandmother; mother, 3 halfsiblings; fiance and fiance's child Physical health (include injuries & life threatening diseases): none identified Social relationships: fiance is main support Bereavement / Loss: Pt reports that he was not able to say good bye to his grandfather when he died 4 years ago and still struggles with this loss.   Living/Environment/Situation:  Living Arrangements: Parent, Spouse/significant other, Children Living conditions (as described by patient or guardian): pt reports that he lives with his grandmother; mother; her 3 kids; fiance and her child How long has patient lived in current situation?: several months "my grandmother raised me." Pt reported that he moved to [place], then to [place] for job, then back with grandmother. What is atmosphere in current home: Chaotic, Temporary  Family History:  Marital status: Long term relationship Long term relationship, how long?: "on the 24th, it will be 3 months." We are engaged.  What types of issues is patient dealing with in the relationship?: "[place]. 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  Does patient have children?: No  Childhood History:  By whom was/is the patient raised?: Mother, Grandparents, Sibling Additional childhood history information: "my grandmother pretty much raised me my whole life." his mother was in and out of his life growing  up. Pt reports that his grandmother and mother have anxiety issues.  Description of patient's relationship with caregiver when they were a child: close to grandmother; strained with mother Patient's description of current relationship with people who raised him/her: strained with both grandmother and mother. pt reports that he is angry about being IVCed and brought to the hospital How were you disciplined when you got in trouble as a child/adolescent?: spanked; yelled out.  Does patient have siblings?: Yes Number of Siblings: 3 Description of patient's current relationship with siblings: 3 half siblings that live with him ("they are my mothers' kids.") Did patient suffer any verbal/emotional/physical/sexual abuse as a child?: No Did patient suffer from severe childhood neglect?: No Has patient ever been sexually abused/assaulted/raped as an adolescent or adult?: No Was the patient ever a victim of a crime or a disaster?: No Witnessed domestic violence?: No Has patient been effected by domestic violence as an adult?: No  Education:  Highest grade of school patient has completed: 10th grade-"they repeated me twice."  Currently a student?: No Name of school: n/a  Learning disability?: Yes What learning problems does patient have?: "I had ADHD and had problems in English."  Employment/Work Situation:   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 has patient been employed?: on and off for several years  Patient's job has been impacted by current illness: No What is the longest time patient has a held a job?: see above Where was the patient employed at that time?: see above Has patient ever been in the [employer]?: No Has patient ever served in combat?: No Did You Receive Any Psychiatric Treatment/Services While in [employer]?: No Are There Guns or Other Weapons in Your  Home?: No Are These Weapons Safely Secured?:  (n/a )  Financial Resources:    Financial resources: Support from parents / caregiver, Medicaid, Food stamps Does patient have a representative payee or guardian?: No  Alcohol/Substance Abuse:   What has been your use of drugs/alcohol within the last 12 months?: Pt denies all substance use except marijuana use (quarter ounce weekly on average). "I socially drink here and there." Pt denies drinking until the point of intoxciation.  If attempted suicide, did drugs/alcohol play a role in this?: No (pt denies any suicidal thoughts or actions prior to this admission. "My grandma lied to get me committed." Pt reports hx of one instance of SI-attempt (cut wrists several years ago).) Alcohol/Substance Abuse Treatment Hx: Past Tx, Inpatient If yes, describe treatment: "I spent one night at [facility] a few years ago when I cut my wrists."  Has alcohol/substance abuse ever caused legal problems?: No  Social Support System:   Conservation officer, natureatient's Community Support System: [employer]/gallery exhibitions officerDescribe Community Support System: fiance and few close friends Type of faith/religion: christian How does patient's faith help to cope with current illness?: n/a   Leisure/Recreation:   Leisure and Hobbies: playing video games and building things  Strengths/Needs:   What things does the patient do well?: good a building things In what areas does patient struggle / problems for patient: anger; impulsivity; coping skills; getting overwhelmed.   Discharge Plan:   Does patient have access to transportation?: Yes Will patient be returning to same living situation after discharge?: Yes Currently receiving community mental health services: Yes (From Whom) Vesta Mixer([facility]) If no, would patient like referral for services when discharged?: No ([place]) Does patient have financial barriers related to discharge medications?: No (some income; [facility])  Summary/Recommendations:   Summary and Recommendations (to be completed by the evaluator): Patient is 25 year old male  involuntarily committed to the hospital. Patient reports hx of ADHD, bipolar disorder, and ODD. Patient reports social drinking, marijuana abuse only. Patient positive for THC and amphetamines. Patient denies the reported suicide threat in IVC paperwork and denies SI/HI/AVH currently. [facility] but has not been taking his mental health medications recently. Recommendations for patient include: crisis stabilization, therapeutic milieu, encourage group attendance and participation, medication management for mood stabilization, and development of comprehensive mental health/sobriety plan.   Smart, Brigette Hopfer LCSW 09/24/2015 10:07 AM

## 2015-09-24 NOTE — H&P (Signed)
Psychiatric Admission Assessment Adult  Patient Identification: Ricardo Gay MRN:  [id number] Date of Evaluation:  09/24/2015 Chief Complaint:  bipolar disorder substance abuse disorder Principal Diagnosis: <principal problem not specified> Diagnosis:   Patient Active Problem List   Diagnosis Date Noted  . Polysubstance abuse [F19.10] 09/23/2015  . Adjustment disorder with disturbance of emotion [F43.29] 04/17/2015   History of Present Illness:: 25 Y/O male who states that her grandmother put him here. States he had tried to hurt himself 5 years ago due to a relaptionship. He has been in the current relationship for 3 months. 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 Smokes marijuana a quarter a week. He denies the allegations by his grandmother other than the death of his grandfather affected him very badly  Ricardo Gay is an 25 y.o. male. Patient brought to [facility] by [facility] with IVC papers. The IVC papers were taken out by his grandmother Ricardo Gay) [phone]. IVC reads: "Respondent diagnosed ADHD, ODD, and Bipolar. He is prescribed Depakote but doesn't take it. He was receiving SSI for his diagnosis but the check stopped recently. He is depressed and having a hard time. He is abusing Crystal Meth, Molly, and Huffing. He drinks alcohol daily until he passes out. This morning at 4:30 am, he put a knife to his throat and threatened to kill himself. He has been fighting his girlfriend for weeks and she has been threatening to leave him. Over the past 24 hrs he would not let her leave their room. They are both physically aggressive with each other. He is a danger to self and others."   Associated Signs/Symptoms: Depression Symptoms:  depressed mood, anxiety, (Hypo) Manic Symptoms:  Irritable Mood, Labiality of Mood, Anxiety Symptoms:  Excessive Worry, Psychotic  Symptoms:  denies PTSD Symptoms: Negative Total Time spent with patient: 45 minutes  Past Psychiatric History:   Is the patient at risk to self? No.  Has the patient been a risk to self in the past 6 months? No.  Has the patient been a risk to self within the distant past? Yes.    Is the patient a risk to others? No.  Has the patient been a risk to others in the past 6 months? No.  Has the patient been a risk to others within the distant past? No.   Prior Inpatient Therapy:  Denies Prior Outpatient Therapy:  [facility]   Alcohol Screening: 1. How often do you have a drink containing alcohol?: 4 or more times a week 2. How many drinks containing alcohol do you have on a typical day when you are drinking?: 10 or more 3. How often do you have six or more drinks on one occasion?: Weekly Preliminary Score: 7 4. How often during the last year have you found that you were not able to stop drinking once you had started?: Weekly 5. How often during the last year have you failed to do what was normally expected from you becasue of drinking?: Weekly 6. How often during the last year have you needed a first drink in the morning to get yourself going after a heavy drinking session?: Weekly 7. How often during the last year have you had a feeling of guilt of remorse after drinking?: Weekly 8. How often during the last year have you been unable to remember what happened the night before because you  had been drinking?: Weekly 9. Have you or someone else been injured as a result of your drinking?: No 10. Has a relative or friend or a doctor or another health worker been concerned about your drinking or suggested you cut down?: Yes, during the last year Alcohol Use Disorder Identification Test Final Score (AUDIT): 30 Brief Intervention: Yes Substance Abuse History in the last 12 months:  Yes.   Consequences of Substance Abuse: Negative Previous Psychotropic Medications: Yes ADHD ( Concerta Ritalin)  Depakote  Psychological Evaluations: No  Past Medical History:  Past Medical History  Diagnosis Date  . Bipolar 1 disorder ([place])   . ADHD (attention deficit hyperactivity disorder)   . Anxiety   . Depression    History reviewed. No pertinent past surgical history. Family History: History reviewed. No pertinent family history. Family Psychiatric  History: grandmother mother depression anxiety Brother OCD Bipolar ADHD Tobacco Screening: '@FLOW'$ ([phone])::1)@ Social History:  History  Alcohol Use  . Yes    Comment: one case beer weekends     History  Drug Use  . Yes  . 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  Additional Social History:      Pain Medications: percocets, klonipins, xanax, hydrocodones off street Prescriptions: depakote Over the Counter: none History of alcohol / drug use?: Yes Longest period of sobriety (when/how long): none Negative Consequences of Use: Financial, Personal relationships, Work / Youth worker Withdrawal Symptoms: Other (Comment) (anxiety/depression) Name of Substance 1: alcohol 1 - Age of First Use: 25 yrs old 1 - Amount (size/oz): one case beer weekend 1 - Frequency: weekend 1 - Duration: since age 55 yrs old 53 - Last Use / Amount: weekend Name of Substance 2: THC  2 - Age of First Use: 25 yrs old 2 - Amount (size/oz): one quarter in one week 2 - Frequency: often 2 - Duration: since age 64 yrs old 2 - Last Use / Amount: 09/23/2015 Name of Substance 3: hydrocodone, xanax, percocet, klonipin 3 - Age of First Use: 25 yrs old 3 - Amount (size/oz): buys off streets  3 - Frequency: often 3 - Duration: 2 yrs  3 - Last Use / Amount: weekend              Allergies:  No Known Allergies Lab Results:  Results for orders placed or performed during the hospital encounter of  09/23/15 (from the past 48 hour(s))  Comprehensive metabolic panel     Status: None   Collection Time: 09/23/15 12:16 PM  Result Value Ref Range   Sodium 140 135 - 145 mmol/L   Potassium 3.7 3.5 - 5.1 mmol/L   Chloride 103 101 - 111 mmol/L   CO2 26 22 - 32 mmol/L   Glucose, Bld 95 65 - 99 mg/dL   BUN 14 6 - 20 mg/dL   Creatinine, Ser 0.78 0.61 - 1.24 mg/dL   Calcium 9.6 8.9 - 10.3 mg/dL   Total Protein 7.7 6.5 - 8.1 g/dL   Albumin 4.8 3.5 - 5.0 g/dL   AST 26 15 - 41 U/L   ALT 20 17 - 63 U/L   Alkaline Phosphatase 69 38 - 126 U/L   Total Bilirubin 1.1 0.3 - 1.2 mg/dL   GFR calc non Af Amer >60 >60 mL/min   GFR calc Af Amer >60 >60 mL/min    Comment: (NOTE) The eGFR has been  calculated using the CKD EPI equation. This calculation has not been validated in all clinical situations. eGFR's persistently <60 mL/min signify possible Chronic Kidney Disease.    Anion gap 11 5 - 15  Ethanol     Status: None   Collection Time: 09/23/15 12:16 PM  Result Value Ref Range   Alcohol, Ethyl (B) <5 <5 mg/dL    Comment:        LOWEST DETECTABLE LIMIT FOR SERUM ALCOHOL IS 5 mg/dL FOR MEDICAL PURPOSES ONLY   CBC with Diff     Status: None   Collection Time: 09/23/15 12:16 PM  Result Value Ref Range   WBC 8.4 4.0 - 10.5 K/uL   RBC 5.18 4.22 - 5.81 MIL/uL   Hemoglobin 16.2 13.0 - 17.0 g/dL   HCT 45.0 39.0 - 52.0 %   MCV 86.9 78.0 - 100.0 fL   MCH 31.3 26.0 - 34.0 pg   MCHC 36.0 30.0 - 36.0 g/dL   RDW 11.9 11.5 - 15.5 %   Platelets 190 150 - 400 K/uL   Neutrophils Relative % 66 %   Neutro Abs 5.6 1.7 - 7.7 K/uL   Lymphocytes Relative 21 %   Lymphs Abs 1.7 0.7 - 4.0 K/uL   Monocytes Relative 10 %   Monocytes Absolute 0.9 0.1 - 1.0 K/uL   Eosinophils Relative 3 %   Eosinophils Absolute 0.2 0.0 - 0.7 K/uL   Basophils Relative 0 %   Basophils Absolute 0.0 0.0 - 0.1 K/uL  Salicylate level     Status: None   Collection Time: 09/23/15 12:16 PM  Result Value Ref Range   Salicylate Lvl  <5.1 2.8 - 30.0 mg/dL  Acetaminophen level     Status: Abnormal   Collection Time: 09/23/15 12:16 PM  Result Value Ref Range   Acetaminophen (Tylenol), Serum <10 (L) 10 - 30 ug/mL    Comment:        THERAPEUTIC CONCENTRATIONS VARY SIGNIFICANTLY. A RANGE OF 10-30 ug/mL MAY BE AN EFFECTIVE CONCENTRATION FOR MANY PATIENTS. HOWEVER, SOME ARE BEST TREATED AT CONCENTRATIONS OUTSIDE THIS RANGE. ACETAMINOPHEN CONCENTRATIONS >150 ug/mL AT 4 HOURS AFTER INGESTION AND >50 ug/mL AT 12 HOURS AFTER INGESTION ARE OFTEN ASSOCIATED WITH TOXIC REACTIONS.   Valproic acid level     Status: Abnormal   Collection Time: 09/23/15 12:16 PM  Result Value Ref Range   Valproic Acid Lvl <10 (L) 50.0 - 100.0 ug/mL    Comment: RESULTS CONFIRMED BY MANUAL DILUTION  Urine rapid drug screen (hosp performed)not at [facility]     Status: Abnormal   Collection Time: 09/23/15  1:03 PM  Result Value Ref Range   Opiates NONE DETECTED NONE DETECTED   Cocaine NONE DETECTED NONE DETECTED   Benzodiazepines NONE DETECTED NONE DETECTED   Amphetamines POSITIVE (A) NONE DETECTED   Tetrahydrocannabinol POSITIVE (A) NONE DETECTED   Barbiturates NONE DETECTED NONE DETECTED    Comment:        DRUG SCREEN FOR MEDICAL PURPOSES ONLY.  IF CONFIRMATION IS NEEDED FOR ANY PURPOSE, NOTIFY LAB WITHIN 5 DAYS.        LOWEST DETECTABLE LIMITS FOR URINE DRUG SCREEN Drug Class       Cutoff (ng/mL) Amphetamine      1000 Barbiturate      200 Benzodiazepine   761 Tricyclics       607 Opiates          300 Cocaine          300 THC  50     Blood Alcohol level:  Lab Results  Component Value Date   ETH <5 09/23/2015   ETH <5 40/98/1191    Metabolic Disorder Labs:  No results found for: HGBA1C, MPG No results found for: PROLACTIN No results found for: CHOL, TRIG, HDL, CHOLHDL, VLDL, LDLCALC  Current Medications: Current Facility-Administered Medications  Medication Dose Route Frequency Provider Last Rate Last Dose  .  acetaminophen (TYLENOL) tablet 650 mg  650 mg Oral Q6H PRN Delfin Gant, NP      . acetaminophen (TYLENOL) tablet 650 mg  650 mg Oral Q4H PRN Delfin Gant, NP      . alum & mag hydroxide-simeth (MAALOX/MYLANTA) 200-200-20 MG/5ML suspension 30 mL  30 mL Oral Q4H PRN Delfin Gant, NP      . alum & mag hydroxide-simeth (MAALOX/MYLANTA) 200-200-20 MG/5ML suspension 30 mL  30 mL Oral PRN Delfin Gant, NP      . divalproex (DEPAKOTE ER) 24 hr tablet 500 mg  500 mg Oral QHS Delfin Gant, NP   500 mg at 09/23/15 2200  . ibuprofen (ADVIL,MOTRIN) tablet 600 mg  600 mg Oral Q8H PRN Delfin Gant, NP      . LORazepam (ATIVAN) tablet 1 mg  1 mg Oral Q8H PRN Delfin Gant, NP      . magnesium hydroxide (MILK OF MAGNESIA) suspension 30 mL  30 mL Oral Daily PRN Delfin Gant, NP      . nicotine (NICODERM CQ - dosed in mg/24 hours) patch 21 mg  21 mg Transdermal Daily Nicholaus Bloom, MD   21 mg at 09/23/15 2019  . ondansetron (ZOFRAN) tablet 4 mg  4 mg Oral Q8H PRN Delfin Gant, NP       PTA Medications: Prescriptions prior to admission  Medication Sig Dispense Refill Last Dose  . divalproex (DEPAKOTE ER) 250 MG 24 hr tablet Take 500 mg by mouth at bedtime.   09/22/2015 at Unknown time    Musculoskeletal: Strength & Muscle Tone: within normal limits Gait & Station: normal Patient leans: normal  Psychiatric Specialty Exam: Physical Exam  Review of Systems  Constitutional: Negative.   HENT: Negative.   Eyes: Negative.   Respiratory:       Half a pack to a pack  Cardiovascular: Negative.   Gastrointestinal: Negative.   Genitourinary: Negative.   Musculoskeletal: Positive for back pain.  Skin: Negative.   Neurological: Negative.   Endo/Heme/Allergies: Negative.   Psychiatric/Behavioral: Positive for depression and substance abuse. The patient is nervous/anxious.     Blood pressure 106/70, pulse 92, temperature 97.2 F (36.2 C), temperature source  Oral, resp. rate 16, height '5\' 6"'$  (1.676 m), weight 65.681 kg (144 lb 12.8 oz), SpO2 100 %.Body mass index is 23.38 kg/(m^2).  General Appearance: Disheveled  Eye Sport and exercise psychologist::  Fair  Speech:  Clear and Coherent  Volume:  Normal  Mood:  Anxious  Affect:  anxious worried upset  Thought Process:  Coherent and Goal Directed  Orientation:  Full (Time, Place, and Person)  Thought Content:  symptoms events worries concerns, minimizes denies  Suicidal Thoughts:  No  Homicidal Thoughts:  No  Memory:  Immediate;   Fair Recent;   Fair Remote;   Fair  Judgement:  Fair  Insight:  Present and Shallow  Psychomotor Activity:  Restlessness  Concentration:  Fair  Recall:  Poor  Fund of Knowledge:Fair  Language: Fair  Akathisia:  No  Handed:  Right  AIMS (  if indicated):     Assets:  Housing Social Support Vocational/Educational  ADL's:  Intact  Cognition: WNL  Sleep:  Number of Hours: 5.75     Treatment Plan Summary: Daily contact with patient to assess and evaluate symptoms and progress in treatment and Medication management Supportive approach/coping skills Polysubstance abuse; monitor for S/S of withdrawal/work a relapse prevention plan Mood instability; resume his Depakote Get collateral information to corroborate the information in the chart Work with CBT/mindfulness Observation Level/Precautions:  15 minute checks  Laboratory:  as per the ED  Psychotherapy:  Individual/group  Medications:  Will resume his Depakote  Consultations:    Discharge Concerns:    Estimated LOS: 3-5 days  Other:     I certify that inpatient services furnished can reasonably be expected to improve the patient's condition.    Nicholaus Bloom, MD 3/17/20179:48 AM

## 2015-09-24 NOTE — [facility] Suicide Risk Assessment (Signed)
[facility]   Nursing information obtained from:  Patient Demographic factors:  Male, Adolescent or young adult, Caucasian, Low socioeconomic status, Unemployed Current Mental Status:  NA Loss Factors:  Financial problems / change in socioeconomic status Historical Factors:  Family history of mental illness or substance abuse, Impulsivity Risk Reduction Factors:  Living with another person, especially a relative  Total Time spent with patient: 45 minutes Principal Problem: Substance induced mood disorder (HCC) Diagnosis:   Patient Active Problem List   Diagnosis Date Noted  . Psychoactive substance-induced organic mood disorder (HCC) [J47.82[F19.94, F06.30] 09/24/2015  . Substance induced mood disorder (HCC) [F19.94] 09/24/2015  . Polysubstance abuse [F19.10] 09/23/2015  . Adjustment disorder with disturbance of emotion [F43.29] 04/17/2015   Subjective Data: see admission H and P  Continued Clinical Symptoms:  Alcohol Use Disorder Identification Test Final Score (AUDIT): 30 The "Alcohol Use Disorders Identification Test", Guidelines for Use in Primary Care, Second Edition.  World Science writerHealth Organization [facility](WHO). Score between 0-7:  no or low risk or alcohol related problems. Score between 8-15:  moderate risk of alcohol related problems. Score between 16-19:  high risk of alcohol related problems. Score 20 or above:  warrants further diagnostic evaluation for alcohol dependence and treatment.   CLINICAL FACTORS:   Alcohol/Substance Abuse/Dependencies   Psychiatric Specialty Exam: ROS  Blood pressure 106/70, pulse 92, temperature 97.2 F (36.2 C), temperature source Oral, resp. rate 16, height 5\' 6"  (1.676 m), weight 65.681 kg (144 lb 12.8 oz), SpO2 100 %.Body mass index is 23.38 kg/(m^2).   COGNITIVE FEATURES THAT CONTRIBUTE TO RISK:  Closed-mindedness, Polarized thinking and Thought constriction (tunnel vision)    SUICIDE RISK:   Moderate:  Frequent suicidal  ideation with limited intensity, and duration, some specificity in terms of plans, no associated intent, good self-control, limited dysphoria/symptomatology, some risk factors present, and identifiable protective factors, including available and accessible social support.  PLAN OF CARE: see admission H and P  I certify that inpatient services furnished can reasonably be expected to improve the patient's condition.   Rachael FeeLUGO,Amos Micheals A, MD 09/24/2015, 3:20 PM

## 2015-09-24 NOTE — Progress Notes (Signed)
D: Patient denies SI/HI or AVH at this time.  He has a flat affect but mood overall is pleasant.  Pt. Is seen in the dayroom interacting well with others.  Pt. States that he slept well last night without medication.  He states that his appetite is good as well.  Pt. Denies any physical complaints at this time.  A: Patient given emotional support from RN. Patient encouraged to come to staff with concerns and/or questions. Patient's medication routine continued. Patient's orders and plan of care reviewed.   R: Patient remains appropriate and cooperative. Will continue to monitor patient q15 minutes for safety.

## 2015-09-24 NOTE — [facility] Suicide Risk Assessment (Signed)
[facility] INPATIENT:  Family/Significant Other Suicide Prevention Education  Suicide Prevention Education:  Contact Attempts: Eden EmmsDenise Hodgin (pt's grandmother) [phone] has been identified by the patient as the family member/significant other with whom the patient will be residing, and identified as the person(s) who will aid the patient in the event of a mental health crisis.  With written consent from the patient, two attempts were made to provide suicide prevention education, prior to and/or following the patient's discharge.  We were unsuccessful in providing suicide prevention education.  A suicide education pamphlet was given to the patient to share with family/significant other.  Date and time of first attempt: 3:30PM on 09/24/15 (voicemail left requesting call back at her earliest convenience).  Date and time of second attempt: 10:05AM on 3/20 (voicemail left requesting call back at her earliest convenience).  Smart, Kayde Warehime LCSW 09/24/2015, 3:52 PM

## 2015-09-25 DIAGNOSIS — F0630 Mood disorder due to known physiological condition, unspecified: Secondary | ICD-10-CM

## 2015-09-25 DIAGNOSIS — F1994 Other psychoactive substance use, unspecified with psychoactive substance-induced mood disorder: Secondary | ICD-10-CM

## 2015-09-25 NOTE — Progress Notes (Signed)
Ricardo Gay  MRN:  [id number] Subjective:  Ricardo Gay continues to deny, minimize. He has been talking to his GF and they are planing to move out of his parents house. He states he would never try to hurt himself or any one else Principal Problem: Substance induced mood disorder (HCC) Diagnosis:   Patient Active Problem List   Diagnosis Date Noted  . Psychoactive substance-induced organic mood disorder (HCC) [Z60.10[F19.94, F06.30] 09/24/2015  . Substance induced mood disorder (HCC) [F19.94] 09/24/2015  . Polysubstance abuse [F19.10] 09/23/2015  . Adjustment disorder with disturbance of emotion [F43.29] 04/17/2015   Total Time spent with patient: 15 minutes  Past Psychiatric History: see admission H and P  Past Medical History:  Past Medical History  Diagnosis Date  . Bipolar 1 disorder (HCC)   . ADHD (attention deficit hyperactivity disorder)   . Anxiety   . Depression    History reviewed. No pertinent past surgical history. Family History: History reviewed. No pertinent family history. Family Psychiatric  History: see admission H and P Social History:  History  Alcohol Use  . Yes    Comment: one case beer weekends     History  Drug Use  . Yes  . Special: Marijuana, Methamphetamines, Hydrocodone    Comment: THC, hydrocodone,xanax, percocet, klonipin,alcohol    Social History   Social History  . Marital Status: Single    Spouse Name: N/A  . Number of Children: N/A  . Years of Education: N/A   Social History Main Topics  . Smoking status: Current Every Day Smoker -- 1.50 packs/day for 6 years  . Smokeless tobacco: None  . Alcohol Use: Yes     Comment: one case beer weekends  . Drug Use: Yes    Special: Marijuana, Methamphetamines, Hydrocodone     Comment: THC, hydrocodone,xanax, percocet, klonipin,alcohol  . Sexual Activity: Yes    Birth Control/ Protection: None   Other Topics Concern  . None   Social History Narrative    Additional Social History:    Pain Medications: percocets, klonipins, xanax, hydrocodones off street Prescriptions: depakote Over the Counter: none History of alcohol / drug use?: Yes Longest period of sobriety (when/how long): none Negative Consequences of Use: Financial, Personal relationships, Work / Programmer, multimediachool Withdrawal Symptoms: Other (Comment) (anxiety/depression) Name of Substance 1: alcohol 1 - Age of First Use: 25 yrs old 1 - Amount (size/oz): one case beer weekend 1 - Frequency: weekend 1 - Duration: since age 25 yrs old 1 - Last Use / Amount: weekend Name of Substance 2: THC  2 - Age of First Use: 25 yrs old 2 - Amount (size/oz): one quarter in one week 2 - Frequency: often 2 - Duration: since age 25 yrs old 2 - Last Use / Amount: 09/23/2015 Name of Substance 3: hydrocodone, xanax, percocet, klonipin 3 - Age of First Use: 25 yrs old 3 - Amount (size/oz): buys off streets  3 - Frequency: often 3 - Duration: 2 yrs  3 - Last Use / Amount: weekend              Sleep: Fair  Appetite:  Fair  Current Medications: Current Facility-Administered Medications  Medication Dose Route Frequency Provider Last Rate Last Dose  . acetaminophen (TYLENOL) tablet 650 mg  650 mg Oral Q6H PRN Earney NavyJosephine C Onuoha, NP      . acetaminophen (TYLENOL) tablet 650 mg  650 mg Oral Q4H PRN Earney NavyJosephine C Onuoha, NP      .  alum & mag hydroxide-simeth (MAALOX/MYLANTA) 200-200-20 MG/5ML suspension 30 mL  30 mL Oral Q4H PRN Earney Navy, NP      . alum & mag hydroxide-simeth (MAALOX/MYLANTA) 200-200-20 MG/5ML suspension 30 mL  30 mL Oral PRN Earney Navy, NP      . divalproex (DEPAKOTE ER) 24 hr tablet 500 mg  500 mg Oral QHS Earney Navy, NP   500 mg at 09/25/15 2053  . ibuprofen (ADVIL,MOTRIN) tablet 600 mg  600 mg Oral Q8H PRN Earney Navy, NP      . LORazepam (ATIVAN) tablet 1 mg  1 mg Oral Q8H PRN Earney Navy, NP   1 mg at 09/25/15 2053  . magnesium hydroxide  (MILK OF MAGNESIA) suspension 30 mL  30 mL Oral Daily PRN Earney Navy, NP      . nicotine (NICODERM CQ - dosed in mg/24 hours) patch 21 mg  21 mg Transdermal Daily Rachael Fee, MD   21 mg at 09/24/15 1058  . ondansetron (ZOFRAN) tablet 4 mg  4 mg Oral Q8H PRN Earney Navy, NP        Lab Results: No results found for this or any previous visit (from the past 48 hour(s)).  Blood Alcohol level:  Lab Results  Component Value Date   ETH <5 09/23/2015   ETH <5 04/16/2015    Physical Findings: AIMS: Facial and Oral Movements Muscles of Facial Expression: None, normal Lips and Perioral Area: None, normal Jaw: None, normal Tongue: None, normal,Extremity Movements Upper (arms, wrists, hands, fingers): None, normal Lower (legs, knees, ankles, toes): None, normal, Trunk Movements Neck, shoulders, hips: None, normal, Overall Severity Severity of abnormal movements (highest score from questions above): None, normal Incapacitation due to abnormal movements: None, normal Patient's awareness of abnormal movements (rate only patient's report): No Awareness, Dental Status Current problems with teeth and/or dentures?: No Does patient usually wear dentures?: No  CIWA:  CIWA-Ar Total: 6 COWS:  COWS Total Score: 2  Musculoskeletal: Strength & Muscle Tone: within normal limits Gait & Station: normal Patient leans: normal  Psychiatric Specialty Exam: Review of Systems  Constitutional: Positive for malaise/fatigue.  HENT: Negative.   Eyes: Negative.   Respiratory: Negative.   Cardiovascular: Negative.   Gastrointestinal: Negative.   Genitourinary: Negative.   Musculoskeletal: Negative.   Skin: Negative.   Neurological: Negative.   Endo/Heme/Allergies: Negative.   Psychiatric/Behavioral: Positive for substance abuse.    Blood pressure 131/90, pulse 80, temperature 98.1 F (36.7 C), temperature source Oral, resp. rate 16, height  (1.676 m), weight 65.681 kg (144 lb 12.8  oz), SpO2 100 %.Body mass index is 23.38 kg/(m^2).  General Appearance: Fairly Groomed  Patent attorney::  Fair  Speech:  Clear and Coherent  Volume:  Normal  Mood:  Anxious  Affect:  Restricted  Thought Process:  Coherent and Goal Directed  Orientation:  Full (Time, Place, and Person)  Thought Content:  symptoms events worries concerns  Suicidal Thoughts:  No  Homicidal Thoughts:  No  Memory:  Immediate;   Fair Recent;   Fair Remote;   Fair  Judgement:  Fair  Insight:  Present  Psychomotor Activity:  Restlessness  Concentration:  Fair  Recall:  [employer] of Knowledge:Fair  Language: Fair  Akathisia:  No  Handed:  Right  AIMS (if indicated):     Assets:  Desire for Improvement  ADL's:  Intact  Cognition: WNL  Sleep:  Number of Hours: 6.25   Treatment  Plan Summary: Daily contact with patient to assess and evaluate symptoms and progress in treatment and Medication management Supportive approach/coping skills Polysubstance abuse; continue to work a relapse prevention plan Work with CBT/mindfulness Rachael Fee, MD 09/25/2015, 9:19 PM

## 2015-09-25 NOTE — Progress Notes (Signed)
DAR Note: Ricardo Gay has been out of the room some today and interacts well with peers.  He did attend groups.  He denies SI/HI or A/V hallucinations.  He denies any pain or discomfort and appears to be in no physical distress.  He compelted his self inventory and reports that his depression and anxiety are 6/10 and his hopelessness is 1/10.  He reports taht his goal for today is "getting out: and he will accomplish his goal by "behaving."  Encouraged continued participation in group and unit activities.  Q 15 minute checks maintained for safety.  We will continue to monitor the progress towards his goals.

## 2015-09-25 NOTE — [facility] Group Notes (Signed)
[facility] Group Notes:  (Clinical Social Work)   05/08/2015     10:00-11:00AM  Summary of Progress/Problems:   In today's process group a decisional balance exercise was used to explore in depth the perceived benefits and costs of alcohol and drugs, as well as the  benefits and costs of replacing these with healthy coping skills.  Patients listed healthy and unhealthy coping techniques, determining with CSW guidance that unhealthy coping techniques work initially, but eventually become harmful.  Motivational Interviewing and the whiteboard were utilized for the exercises.  The patient expressed that the unhealthy coping he often uses is substance abuse, adding that he uses this unhealthy coping technique because it is "easy and fast."  He participated some in the topic, laid his head on a table for part of the group time.  He was observed to act as though he was tired and not feeling well.  Type of Therapy:  Group Therapy - Process   Participation Level:  Active  Participation Quality:  Attentive  Affect:  Depressed and Flat  Cognitive:  Oriented  Insight:  Improving  Engagement in Therapy:  Improving  Modes of Intervention:  Education, Motivational Interviewing  Ambrose MantleMareida Grossman-Orr, LCSW 09/25/2015, 11:04 AM

## 2015-09-25 NOTE — Progress Notes (Signed)
D.  Pt pleasant on approach, no complaints voiced at this time.  Pt did attend evening AA group, observed interacting appropriately with peers on the unit.  Denies SI/HI/hallucinations at this time.  A.  Support and encouragement offered  R.  Pt remains safe on the unit, will continue to monitor.

## 2015-09-26 NOTE — Plan of Care (Signed)
Problem: Alteration in mood & ability to function due to Goal: STG-Patient will attend groups Outcome: Progressing Pt has attended the AA groups on night shift this weekend

## 2015-09-26 NOTE — Progress Notes (Signed)
Pt attended the evening AA speaker meeting. 

## 2015-09-26 NOTE — Progress Notes (Signed)
Patient did attend the evening speaker AA meeting. Pt did exit group soon after it began and was found with a male patient in his room. Pt was asked to exit room by the nurse. Patients were reminded of the rules. Pt returned for the remainder of the group.

## 2015-09-26 NOTE — Progress Notes (Signed)
D) Pt has attended the groups but offers little response when there is a conversation going on in group. Pt sits and smiles a lot. Will sit in the dayroom and interact with his peers. Laughs and jokes around. Is limited and shows little insight into the issues that brought Pt to the hospital. Rates his depression at a 7, hopelessness at a 1 and his anxiety at a 8. Denies SI and HI. A) Given support, reassurance and praise. Encouragement to go to the groups. R) Pt denies SI and HI.

## 2015-09-26 NOTE — Progress Notes (Signed)
[facility] MD Progress Note  09/26/2015 8:29 PM JAMANI BEARCE  MRN:  [id number] Subjective:  Olney states that he has been communicating with his GF and that everything is OK with her. Once he leaves he is planning to get a temporary placement until they get the means to get a permanent residence out of his grandmothers place Principal Problem: Substance induced mood disorder (HCC) Diagnosis:   Patient Active Problem List   Diagnosis Date Noted  . Psychoactive substance-induced organic mood disorder (HCC) [N82.95, F06.30] 09/24/2015  . Substance induced mood disorder (HCC) [F19.94] 09/24/2015  . Polysubstance abuse [F19.10] 09/23/2015  . Adjustment disorder with disturbance of emotion [F43.29] 04/17/2015   Total Time spent with patient: 15 minutes  Past Psychiatric History: see admission H and P  Past Medical History:  Past Medical History  Diagnosis Date  . Bipolar 1 disorder (HCC)   . ADHD (attention deficit hyperactivity disorder)   . Anxiety   . Depression    History reviewed. No pertinent past surgical history. Family History: History reviewed. No pertinent family history. Family Psychiatric  History: see admission H and P  Social History:  History  Alcohol Use  . Yes    Comment: one case beer weekends     History  Drug Use  . Yes  . Special: Marijuana, Methamphetamines, Hydrocodone    Comment: THC, hydrocodone,xanax, percocet, klonipin,alcohol    Social History   Social History  . Marital Status: Single    Spouse Name: N/A  . Number of Children: N/A  . Years of Education: N/A   Social History Main Topics  . Smoking status: Current Every Day Smoker -- 1.50 packs/day for 6 years  . Smokeless tobacco: None  . Alcohol Use: Yes     Comment: one case beer weekends  . Drug Use: Yes    Special: Marijuana, Methamphetamines, Hydrocodone     Comment: THC, hydrocodone,xanax, percocet, klonipin,alcohol  . Sexual Activity: Yes    Birth Control/ Protection: None   Other  Topics Concern  . None   Social History Narrative   Additional Social History:    Pain Medications: percocets, klonipins, xanax, hydrocodones off street Prescriptions: depakote Over the Counter: none History of alcohol / drug use?: Yes Longest period of sobriety (when/how long): none Negative Consequences of Use: Financial, Personal relationships, Work / Programmer, multimedia Withdrawal Symptoms: Other (Comment) (anxiety/depression) Name of Substance 1: alcohol 1 - Age of First Use: 25 yrs old 1 - Amount (size/oz): one case beer weekend 1 - Frequency: weekend 1 - Duration: since age 63 yrs old 1 - Last Use / Amount: weekend Name of Substance 2: THC  2 - Age of First Use: 25 yrs old 2 - Amount (size/oz): one quarter in one week 2 - Frequency: often 2 - Duration: since age 56 yrs old 2 - Last Use / Amount: 09/23/2015 Name of Substance 3: hydrocodone, xanax, percocet, klonipin 3 - Age of First Use: 25 yrs old 3 - Amount (size/oz): buys off streets  3 - Frequency: often 3 - Duration: 2 yrs  3 - Last Use / Amount: weekend              Sleep: Fair  Appetite:  Fair  Current Medications: Current Facility-Administered Medications  Medication Dose Route Frequency Provider Last Rate Last Dose  . acetaminophen (TYLENOL) tablet 650 mg  650 mg Oral Q6H PRN Earney Navy, NP      . acetaminophen (TYLENOL) tablet 650 mg  650 mg Oral Q4H  PRN Earney Navy, NP      . alum & mag hydroxide-simeth (MAALOX/MYLANTA) 200-200-20 MG/5ML suspension 30 mL  30 mL Oral Q4H PRN Earney Navy, NP      . alum & mag hydroxide-simeth (MAALOX/MYLANTA) 200-200-20 MG/5ML suspension 30 mL  30 mL Oral PRN Earney Navy, NP      . divalproex (DEPAKOTE ER) 24 hr tablet 500 mg  500 mg Oral QHS Earney Navy, NP   500 mg at 09/25/15 2053  . ibuprofen (ADVIL,MOTRIN) tablet 600 mg  600 mg Oral Q8H PRN Earney Navy, NP      . LORazepam (ATIVAN) tablet 1 mg  1 mg Oral Q8H PRN Earney Navy,  NP   1 mg at 09/25/15 2053  . magnesium hydroxide (MILK OF MAGNESIA) suspension 30 mL  30 mL Oral Daily PRN Earney Navy, NP      . nicotine (NICODERM CQ - dosed in mg/24 hours) patch 21 mg  21 mg Transdermal Daily Rachael Fee, MD   21 mg at 09/26/15 0802  . ondansetron (ZOFRAN) tablet 4 mg  4 mg Oral Q8H PRN Earney Navy, NP        Lab Results: No results found for this or any previous visit (from the past 48 hour(s)).  Blood Alcohol level:  Lab Results  Component Value Date   ETH <5 09/23/2015   ETH <5 04/16/2015    Physical Findings: AIMS: Facial and Oral Movements Muscles of Facial Expression: None, normal Lips and Perioral Area: None, normal Jaw: None, normal Tongue: None, normal,Extremity Movements Upper (arms, wrists, hands, fingers): None, normal Lower (legs, knees, ankles, toes): None, normal, Trunk Movements Neck, shoulders, hips: None, normal, Overall Severity Severity of abnormal movements (highest score from questions above): None, normal Incapacitation due to abnormal movements: None, normal Patient's awareness of abnormal movements (rate only patient's report): No Awareness, Dental Status Current problems with teeth and/or dentures?: No Does patient usually wear dentures?: No  CIWA:  CIWA-Ar Total: 2 COWS:  COWS Total Score: 2  Musculoskeletal: Strength & Muscle Tone: within normal limits Gait & Station: normal Patient leans: normal  Psychiatric Specialty Exam: Review of Systems  Constitutional: Negative.   HENT: Negative.   Eyes: Negative.   Respiratory: Negative.   Cardiovascular: Negative.   Gastrointestinal: Negative.   Genitourinary: Negative.   Musculoskeletal: Negative.   Skin: Negative.   Neurological: Negative.   Endo/Heme/Allergies: Negative.   Psychiatric/Behavioral: Positive for substance abuse.    Blood pressure 136/82, pulse 100, temperature 97.7 F (36.5 C), temperature source Oral, resp. rate 16, height  (1.676 m),  weight 65.681 kg (144 lb 12.8 oz), SpO2 100 %.Body mass index is 23.38 kg/(m^2).  General Appearance: Fairly Groomed  Patent attorney::  Fair  Speech:  Clear and Coherent  Volume:  Normal  Mood:  Euthymic  Affect:  Restricted  Thought Process:  Coherent and Goal Directed  Orientation:  Full (Time, Place, and Person)  Thought Content:  symptoms events worries concerns  Suicidal Thoughts:  No  Homicidal Thoughts:  No  Memory:  Immediate;   Fair Recent;   Fair Remote;   Fair  Judgement:  Fair  Insight:  Shallow  Psychomotor Activity:  Normal  Concentration:  Fair  Recall:  [employer] of Knowledge:Fair  Language: Fair  Akathisia:  No  Handed:  Right  AIMS (if indicated):     Assets:  Desire for Improvement Social Support  ADL's:  Intact  Cognition: WNL  Sleep:  Number of Hours: 6.25   Treatment Plan Summary: Daily contact with patient to assess and evaluate symptoms and progress in treatment and Medication management Supporitve approach/coping skills Substance abuse; work a relapse prevention plan Mood instability; continue to work with the Depakote  Work with CBT/mindfulness/anger management/stress management Rachael FeeLUGO,Suzanna Zahn A, MD 09/26/2015, 8:29 PM

## 2015-09-26 NOTE — [facility] Group Notes (Signed)
[facility] Group Notes:  (Clinical Social Work)  09/26/2015  10:00-11:00AM  Summary of Progress/Problems:   The main focus of today's process group was to   1)  discuss the importance of adding supports  2)  define health supports versus unhealthy supports  3)  identify the patient's current unhealthy supports and plan how to handle them  4)  Identify the patient's current healthy supports and plan what to add.  An emphasis was placed on using counselor, doctor, therapy groups, 12-step groups, and problem-specific support groups to expand supports.    The patient expressed full comprehension of the concepts presented, and agreed that there is a need to add more supports.  The patient stated he has a unique issue in his life, namely that his significant other miscarried at just a few months.  He was supportive of others, but quieter today.  Type of Therapy:  Process Group with Motivational Interviewing  Participation Level:  Active  Participation Quality:  Attentive  Affect:  Blunted and Depressed  Cognitive:  Appropriate  Insight:  Developing/Improving  Engagement in Therapy:  Engaged  Modes of Intervention:   Education, Support and Processing, Activity  Ambrose MantleMareida Grossman-Orr, LCSW 09/26/2015

## 2015-09-27 MED ORDER — NICOTINE 21 MG/24HR TD PT24: 21.0000 mg | MEDICATED_PATCH | Freq: Every day | TRANSDERMAL | Status: AC

## 2015-09-27 MED ORDER — DIVALPROEX SODIUM ER 500 MG PO TB24: 500.0000 mg | ORAL_TABLET | Freq: Every day | ORAL | Status: AC

## 2015-09-27 NOTE — Progress Notes (Signed)
Patient ID: Sandi CarneCody A Gay, male   DOB: April 08, 1991, 25 y.o.   MRN: [id number] Patient was discharged to home/selfcare in the care of his mother.  Patient acknowledged understanding of discharge planning and stated and signed that he had received all of his belongings upon discharge.   Patient denies SI, HI and AVH and is able to contract for safety.

## 2015-09-27 NOTE — Progress Notes (Signed)
  [facility]  Will you be returning to the same living situation after discharge:  Yes,  home At discharge, do you have transportation home?: Yes,  fiance Do you have the ability to pay for your medications: [facility]  Release of information consent forms completed and submitted to medical records by CSW.  Patient to Follow up at: Follow-up Information    Follow up with [facility].   Why:  Walk in between 8am-9am Monday through Friday for hospital follow-up/medication management/assessment for counseling services.    Contact information:   [address] [place], [place] [address] Phone: [phone] Fax: [phone]      Next level of care provider has access [facility] Link:no  Safety Planning and Suicide Prevention discussed: Yes,  SPE completed with pt; contact attempts made with pt's grandmother. SPI pamphlet and Mobile Crisis information provided. Pt did not consent for CSW to contact any other family  Have you used any form of tobacco in the last 30 days? (Cigarettes, Smokeless Tobacco, Cigars, and/or Pipes): Yes  Has patient been referred to the Quitline?: Patient refused referral  Patient has been referred for addiction treatment: Yes  Smart, Kamiyah Kindel LCSW 09/27/2015, 10:08 AM
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
## 2015-09-27 NOTE — [facility] Suicide Risk Assessment (Signed)
[facility]   Principal Problem: Substance induced mood disorder [facility](HCC) Discharge Diagnoses:  Patient Active Problem List   Diagnosis Date Noted  . Psychoactive substance-induced organic mood disorder (HCC) [E45.40[F19.94, F06.30] 09/24/2015  . Substance induced mood disorder (HCC) [F19.94] 09/24/2015  . Polysubstance abuse [F19.10] 09/23/2015  . Adjustment disorder with disturbance of emotion [F43.29] 04/17/2015    Total Time spent with patient: 20 minutes  Musculoskeletal: Strength & Muscle Tone: within normal limits Gait & Station: normal Patient leans: normal  Psychiatric Specialty Exam: Review of Systems  Constitutional: Negative.   HENT: Negative.   Respiratory: Negative.   Cardiovascular: Negative.   Gastrointestinal: Negative.   Genitourinary: Negative.   Musculoskeletal: Negative.   Skin: Negative.   Endo/Heme/Allergies: Negative.   Psychiatric/Behavioral: Positive for depression.    Blood pressure 117/78, pulse 89, temperature 97.4 F (36.3 C), temperature source Oral, resp. rate 18, height 5\' 6"  (1.676 m), weight 65.681 kg (144 lb 12.8 oz), SpO2 100 %.Body mass index is 23.38 kg/(m^2).  General Appearance: Fairly Groomed  Patent attorneyye Contact::  Fair  Speech:  Clear and Coherent409  Volume:  Normal  Mood:  Euthymic  Affect:  Appropriate  Thought Process:  Coherent and Goal Directed  Orientation:  Full (Time, Place, and Person)  Thought Content:  plans as he moves on relapse prevention plan  Suicidal Thoughts:  No  Homicidal Thoughts:  No  Memory:  Immediate;   Fair Recent;   Fair Remote;   Fair  Judgement:  Fair  Insight:  Shallow  Psychomotor Activity:  Normal  Concentration:  Fair  Recall:  Fis[employer]Fair  Fund of Knowledge:Fair  Language: Fair  Akathisia:  No  Handed:  Right  AIMS (if indicated):     Assets:  Desire for Improvement Housing Social Support  Sleep:  Number of Hours: 6  Cognition: WNL  ADL's:  Intact  In full contact with  reality. There are no active S/S of withdrawal. There are no active SI plans or intent. He is committed to stay abstinent  Mental Status Per Nursing Assessment::   On Admission:  NA  Demographic Factors:  Male, Adolescent or young adult and Caucasian  Loss Factors: Loss of significant relationship  Historical Factors: none identified  Risk Reduction Factors:   Sense of responsibility to family, Living with another person, especially a relative and Positive social support  Continued Clinical Symptoms:  Alcohol/Substance Abuse/Dependencies  Cognitive Features That Contribute To Risk:  None    Suicide Risk:  Minimal: No identifiable suicidal ideation.  Patients presenting with no risk factors but with morbid ruminations; may be classified as minimal risk based on the severity of the depressive symptoms  Follow-up Information    Follow up with [facility].   Why:  Walk in between 8am-9am Monday through Friday for hospital follow-up/medication management/assessment for counseling services.    Contact information:   [address] [place], [place] [address] Phone: [phone] Fax: [phone]      Plan Of Care/Follow-up recommendations:  Activity:  as tolerated Diet:  regular [facility] Adriene Knipfer A, MD 09/27/2015, 12:55 PM

## 2015-09-27 NOTE — [facility] Group Notes (Signed)
   [facility] Note  09/27/2015  8:45 AM   Participation Quality: Alert, Appropriate and Oriented  Mood/Affect: Blunted  Depression Rating: 2  Anxiety Rating: 2  Thoughts of Suicide: Pt denies SI/HI  Will you contract for safety? Yes  Current AVH: Pt denies  Plan for Discharge/Comments: Pt attended discharge planning group and actively participated in group. CSW provided pt with today's workbook. Patient plans to return home to [facility]  Transportation Means: Pt reports access to transportation  Supports: No supports mentioned at this time  Samuella BruinKristin Maisey Deandrade, MSW, Johnson & JohnsonLCSW Clinical Social Worker [facility] [phone]

## 2015-09-27 NOTE — Progress Notes (Signed)
D: Patient alert and oriented x 4. Patient denies pain/SI/HI/AVH. Patient left group and went to room. Male patient was found in room with patient talking. Patient was sitting on bench and male patient was in corner of room. Patient was reminded of rules of unit, and returned to AA group.  A: Staff to monitor Q 15 mins for safety. Encouragement and support offered. Scheduled medications administered per orders. R: Patient remains safe on the unit. Patient attended group tonight. Patient visible on hte unit and interacting with peers. Patient taking administered medications.
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
## 2020-05-26 ENCOUNTER — Encounter (HOSPITAL_COMMUNITY): Payer: Self-pay

## 2020-05-26 ENCOUNTER — Emergency Department (HOSPITAL_COMMUNITY)
Admission: EM | Admit: 2020-05-26 | Discharge: 2020-05-26 | Disposition: A | Discharge status: Home / Self Care | Payer: Self-pay | Attending: Emergency Medicine | Admitting: Emergency Medicine

## 2020-05-26 ENCOUNTER — Other Ambulatory Visit: Payer: Self-pay

## 2020-05-26 DIAGNOSIS — F17200 Nicotine dependence, unspecified, uncomplicated: Secondary | ICD-10-CM | POA: Insufficient documentation

## 2020-05-26 DIAGNOSIS — S7011XA Contusion of right thigh, initial encounter: Secondary | ICD-10-CM | POA: Insufficient documentation

## 2020-05-26 DIAGNOSIS — W208XXA Other cause of strike by thrown, projected or falling object, initial encounter: Secondary | ICD-10-CM | POA: Insufficient documentation

## 2020-05-26 NOTE — ED Provider Notes (Signed)
[facility]-EMERGENCY DEPT Provider Note   CSN: [id number] Arrival date & time: 05/26/20  1236     History Chief Complaint  Patient presents with   Leg Pain   Fall    Ricardo Gay is a 29 y.o. male.  HPI Patient states he fell onto a railing and some boxes fell onto.  States pain to mid thighs but worse on the right.  EMS [place].  States there is some tingling on the leg.  No back pain.  No other injury.  Denies drug use but per EMS reportedly had pinpoint pupils.    Past Medical History:  Diagnosis Date   ADHD (attention deficit hyperactivity disorder)    Anxiety    Bipolar 1 disorder [facility])    Depression     Patient Active Problem List   Diagnosis Date Noted   Psychoactive substance-induced organic mood disorder (HCC) 09/24/2015   Substance induced mood disorder (HCC) 09/24/2015   Polysubstance abuse (HCC) 09/23/2015   Adjustment disorder with disturbance of emotion 04/17/2015    History reviewed. No pertinent surgical history.     No family history on file.  Social History   Tobacco Use   Smoking status: Current Every Day Smoker    Packs/day: 1.50    Years: 6.00    Pack years: 9.00   Smokeless tobacco: Current User  Vaping Use   Vaping Use: Never used  Substance Use Topics   Alcohol use: Yes    Comment: one case beer weekends   Drug use: Yes    Types: Marijuana, Methamphetamines, Hydrocodone    Comment: THC, hydrocodone,xanax, percocet, klonipin,alcohol    Home Medications Prior to Admission medications   Medication Sig Start Date End Date Taking? Authorizing Provider  divalproex (DEPAKOTE ER) 500 MG 24 hr tablet Take 1 tablet (500 mg total) by mouth at bedtime. For mood stabilization 09/27/15   Nwoko, Nicole Kindred I, NP  nicotine (NICODERM CQ - DOSED IN MG/24 HOURS) 21 mg/24hr patch Place 1 patch (21 mg total) onto the skin daily. For smoking cessation 09/27/15   Sanjuana Kava,  NP    Allergies    Patient has no known allergies.  Review of Systems   Review of Systems  Constitutional: Negative for appetite change.  HENT: Negative for congestion.   Cardiovascular: Negative for chest pain.  Gastrointestinal: Negative for abdominal pain.  Genitourinary: Negative for flank pain.  Musculoskeletal: Negative for back pain.  Neurological: Negative for weakness and numbness.  Psychiatric/Behavioral: Negative for confusion.    Physical Exam Updated Vital Signs BP 124/64 (BP Location: Right Arm)    Pulse 85    Temp 98.2 F (36.8 C) (Oral)    Resp 16    SpO2 99%   Physical Exam Vitals and nursing note reviewed.  Constitutional:      Comments: Sitting in bed will with eyes closed but does arouse to stimulation.  HENT:     Head: Normocephalic and atraumatic.  Eyes:     General: Scleral icterus present.     Pupils: Pupils are equal, round, and reactive to light.  Cardiovascular:     Rate and Rhythm: Regular rhythm.  Pulmonary:     Breath sounds: No wheezing or rhonchi.  Chest:     Chest wall: No tenderness.  Abdominal:     Tenderness: There is no abdominal tenderness.  Musculoskeletal:        General: No tenderness.  Comments: No deformity seen of femurs.  No tenderness of the pelvis.  No upper extremity tenderness.  No cervical spine tenderness.  No lumbar spine tenderness.  Abrasion to right lower leg.  No underlying bony tenderness.  Neurological:     Mental Status: He is alert.     ED Results / Procedures / Treatments   Labs (all labs ordered are listed, but only abnormal results are displayed) Labs Reviewed - No data to display  EKG None  Radiology No results found.  Procedures Procedures (including critical care time)  Medications Ordered in ED Medications - No data to display  ED Course  I have reviewed the triage vital signs and the nursing notes.  Pertinent labs & imaging results that were available during my care of the  patient were reviewed by me and considered in my medical decision making (see chart for details).    MDM Rules/Calculators/A&P                         Patient with reported fall and contusion to thighs.  Does not appear to need imaging.  No deformity.  Abrasion to right lower leg.  Will discharge home.  No evidence of trauma to head.  Final Clinical Impression(s) / ED Diagnoses Final diagnoses:  Contusion of right thigh, initial encounter    Rx / DC Orders ED Discharge Orders    None       Benjiman Core, MD 05/26/20 1413

## 2020-05-26 NOTE — ED Triage Notes (Signed)
Pt bib ems from a warehouse where he is currently living. Had mechanical fall and a heavy box fell on his right leg. Endorses leg pain but no obvious deformities present. Denies hitting head or LOC. Per ems, endorses homelessness and ETOH use. Denies other substance use. Pupils "pin point" per EMS.   EMS vitals 118/80 98% on RA HR 78 11f  CBG 118

## 2020-05-26 NOTE — ED Notes (Signed)
Pt denies numbness or tingling of RLE. Able to bend knee without hesitation or apparent limitations. Endorses pain when performing dorsiflexion. States he "can't really" wiggle right toes.Endorses pain when he tries to do so. No apparent injury or deformity noted to foot.
